# Patient Record
Sex: Male | Born: 1995 | Race: Black or African American | Hispanic: No | Marital: Single | Smoking: Never smoker
Health system: Southern US, Community
[De-identification: ages and names within clinical notes are randomized; demographics above are authoritative.]

## PROBLEM LIST (undated history)

## (undated) DIAGNOSIS — F909 Attention-deficit hyperactivity disorder, unspecified type: Secondary | ICD-10-CM

## (undated) HISTORY — PX: APPENDECTOMY: SHX54

## (undated) HISTORY — DX: Attention-deficit hyperactivity disorder, unspecified type: F90.9

---
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
## 2014-09-27 IMAGING — CR DG LUMBAR SPINE COMPLETE 4+V
5 series · 5 of 5 positions shown · non-contrast
Comparison: None.

CLINICAL DATA: Low back pain for 1 year, now worsening.

EXAM:
LUMBAR SPINE - COMPLETE 4+ VIEW

[t l-spine a.p.]
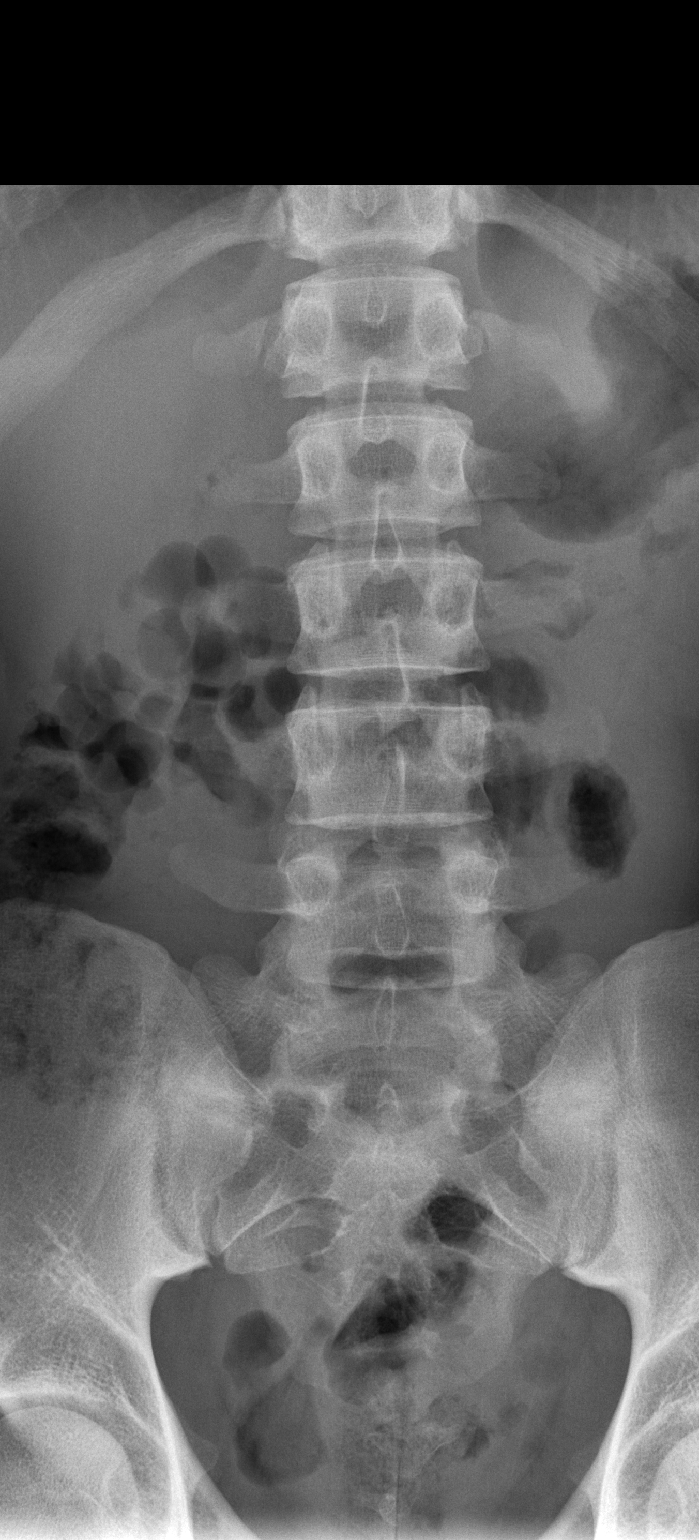

[t l-spine oblique exposure (1 of 2)]
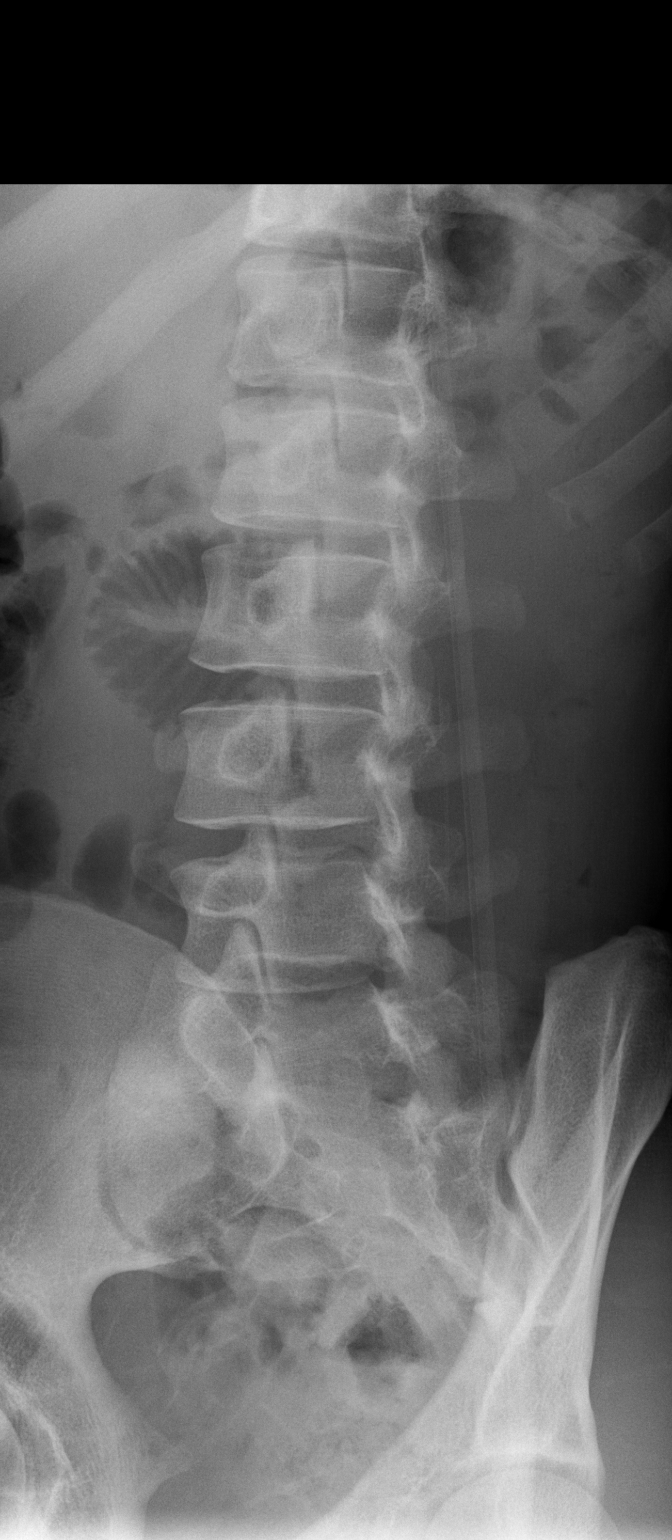

[t l-spine oblique exposure (2 of 2)]
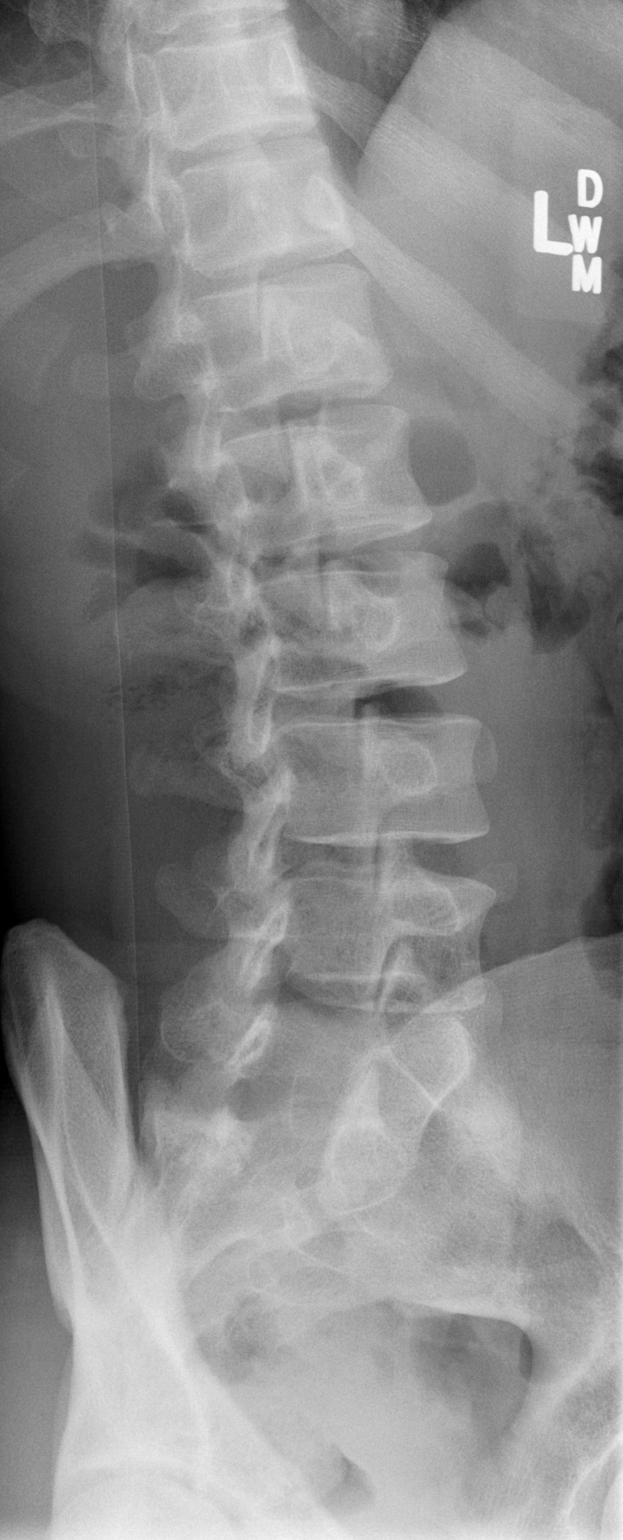

[t l-spine lat]
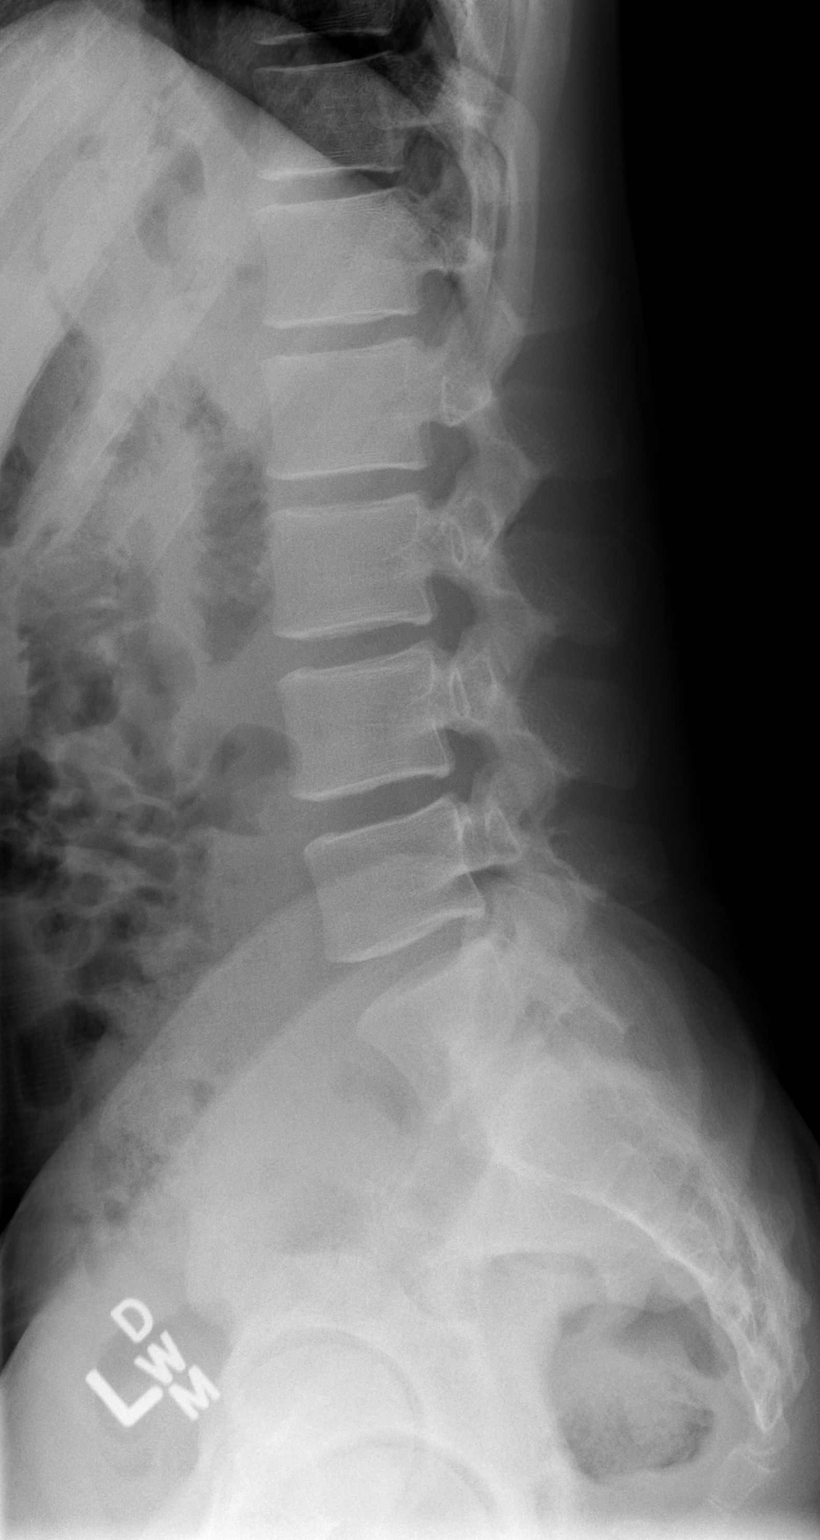

[t l-spine l5-s1 spot]
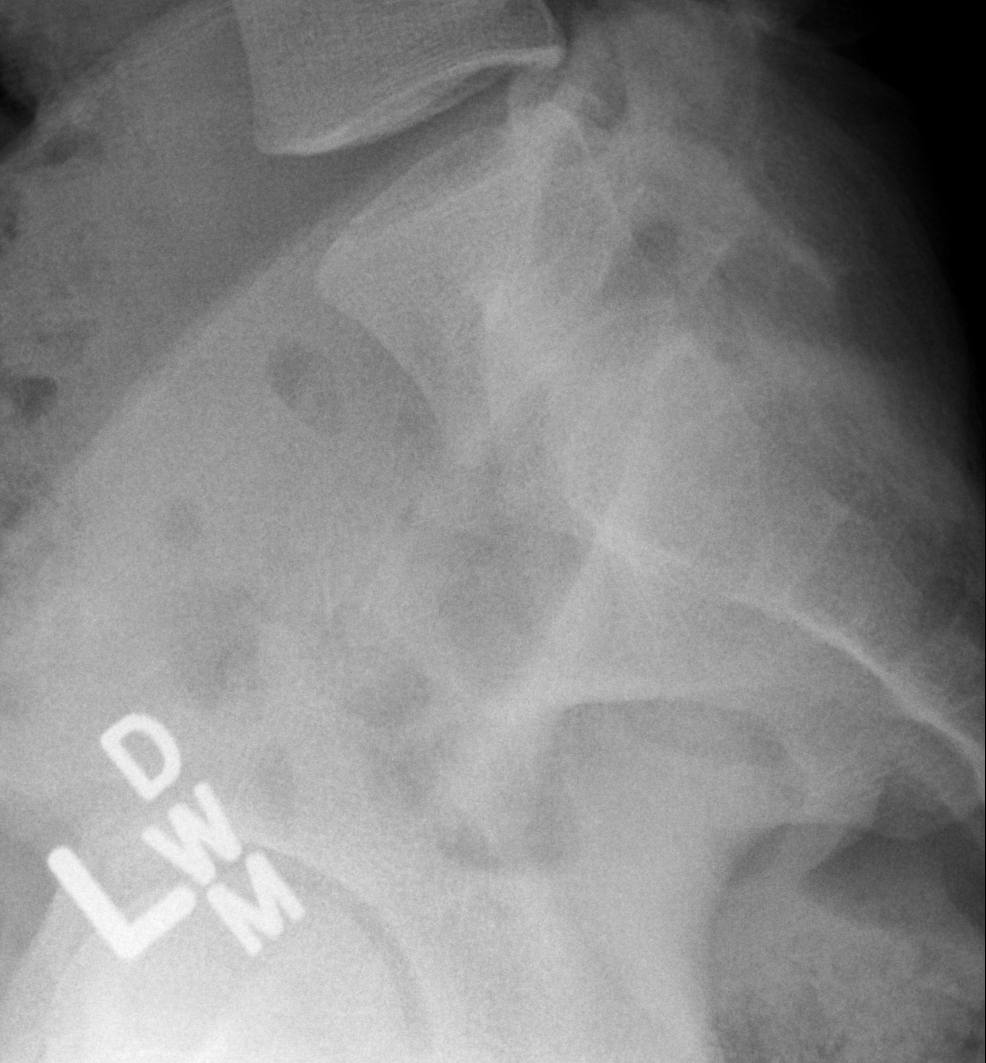

[5 of 5 positions shown; findings below may reference images not displayed]

FINDINGS: Four non rib-bearing lumbar-type vertebral bodies are intact and
aligned with maintenance of the lumbar lordosis. Small L1 ribs.
Intervertebral disc heights are normal. No pars interarticularis
defects. No destructive bony lesions.

Sacroiliac joints are symmetric. Included prevertebral and
paraspinal soft tissue planes are non-suspicious.
IMPRESSION: Transitional anatomy without acute fracture deformity or
malalignment.

  By: Rolan Tojin

## 2015-11-24 ENCOUNTER — Telehealth: Payer: Self-pay | Admitting: *Deleted

## 2015-11-24 NOTE — Telephone Encounter (Signed)
Unable to reach patient at time of Pre-Visit Call.  Left message for patient to return call when available.    

## 2015-11-25 ENCOUNTER — Encounter: Payer: Self-pay | Admitting: Family Medicine

## 2015-11-25 ENCOUNTER — Ambulatory Visit (INDEPENDENT_AMBULATORY_CARE_PROVIDER_SITE_OTHER): Payer: 59 | Admitting: Family Medicine

## 2015-11-25 VITALS — BP 108/76 | HR 67 | Temp 98.2°F | Ht 71.0 in | Wt 160.8 lb

## 2015-11-25 DIAGNOSIS — M549 Dorsalgia, unspecified: Secondary | ICD-10-CM

## 2015-11-25 DIAGNOSIS — G8929 Other chronic pain: Secondary | ICD-10-CM

## 2015-11-25 DIAGNOSIS — F909 Attention-deficit hyperactivity disorder, unspecified type: Secondary | ICD-10-CM | POA: Diagnosis not present

## 2015-11-25 HISTORY — DX: Attention-deficit hyperactivity disorder, unspecified type: F90.9

## 2015-11-25 MED ORDER — LISDEXAMFETAMINE DIMESYLATE 40 MG PO CAPS: 40.0000 mg | ORAL_CAPSULE | Freq: Every day | ORAL | 0 refills | Status: DC

## 2015-11-25 NOTE — Progress Notes (Signed)
Rx vPre visit review using our clinic review tool, if applicable. No additional management support is needed unless otherwise documented below in the visit note.

## 2015-11-25 NOTE — Patient Instructions (Signed)
EXERCISES  RANGE OF MOTION (ROM) AND STRETCHING EXERCISES - Low Back Sprain Most people with lower back pain will find that their symptoms get worse with excessive bending forward (flexion) or arching at the lower back (extension). The exercises that will help resolve your symptoms will focus on the opposite motion.  Your physician, physical therapist or athletic trainer will help you determine which exercises will be most helpful to resolve your lower back pain. Do not complete any exercises without first consulting with your caregiver. Discontinue any exercises which make your symptoms worse, until you speak to your caregiver. If you have pain, numbness or tingling which travels down into your buttocks, leg or foot, the goal of the therapy is for these symptoms to move closer to your back and eventually resolve. Sometimes, these leg symptoms will get better, but your lower back pain may worsen. This is often an indication of progress in your rehabilitation. Be very alert to any changes in your symptoms and the activities in which you participated in the 24 hours prior to the change. Sharing this information with your caregiver will allow him or her to most efficiently treat your condition. These exercises may help you when beginning to rehabilitate your injury. Your symptoms may resolve with or without further involvement from your physician, physical therapist or athletic trainer. While completing these exercises, remember:   Restoring tissue flexibility helps normal motion to return to the joints. This allows healthier, less painful movement and activity.  An effective stretch should be held for at least 30 seconds.  A stretch should never be painful. You should only feel a gentle lengthening or release in the stretched tissue. FLEXION RANGE OF MOTION AND STRETCHING EXERCISES:  STRETCH - Flexion, Single Knee to Chest   Lie on a firm bed or floor with both legs extended in front of you.  Keeping  one leg in contact with the floor, bring your opposite knee to your chest. Hold your leg in place by either grabbing behind your thigh or at your knee.  Pull until you feel a gentle stretch in your low back. Hold 15-20 seconds.  Slowly release your grasp and repeat the exercise with the opposite side. Repeat 2 times. Complete this exercise 1-2 times per day.   STRETCH - Flexion, Double Knee to Chest  Lie on a firm bed or floor with both legs extended in front of you.  Keeping one leg in contact with the floor, bring your opposite knee to your chest.  Tense your stomach muscles to support your back and then lift your other knee to your chest. Hold your legs in place by either grabbing behind your thighs or at your knees.  Pull both knees toward your chest until you feel a gentle stretch in your low back. Hold 15-20 seconds.  Tense your stomach muscles and slowly return one leg at a time to the floor. Repeat 2 times. Complete this exercise 1-2 times per day.   STRETCH - Low Trunk Rotation  Lie on a firm bed or floor. Keeping your legs in front of you, bend your knees so they are both pointed toward the ceiling and your feet are flat on the floor.  Extend your arms out to the side. This will stabilize your upper body by keeping your shoulders in contact with the floor.  Gently and slowly drop both knees together to one side until you feel a gentle stretch in your low back. Hold for 15-20 seconds.  Tense your   stomach muscles to support your lower back as you bring your knees back to the starting position. Repeat the exercise to the other side. Repeat 2 times. Complete this exercise 1-2 times per day  EXTENSION RANGE OF MOTION AND FLEXIBILITY EXERCISES:  STRETCH - Extension, Prone on Elbows   Lie on your stomach on the floor, a bed will be too soft. Place your palms about shoulder width apart and at the height of your head.  Place your elbows under your shoulders. If this is too  painful, stack pillows under your chest.  Allow your body to relax so that your hips drop lower and make contact more completely with the floor.  Hold this position for 15-20 seconds.  Slowly return to lying flat on the floor. Repeat 2 times. Complete this exercise 1-2 times per day.   RANGE OF MOTION - Extension, Prone Press Ups  Lie on your stomach on the floor, a bed will be too soft. Place your palms about shoulder width apart and at the height of your head.  Keeping your back as relaxed as possible, slowly straighten your elbows while keeping your hips on the floor. You may adjust the placement of your hands to maximize your comfort. As you gain motion, your hands will come more underneath your shoulders.  Hold this position 15-20 seconds.  Slowly return to lying flat on the floor. Repeat 2 times. Complete this exercise 1-2 times per day.   RANGE OF MOTION- Quadruped, Neutral Spine   Assume a hands and knees position on a firm surface. Keep your hands under your shoulders and your knees under your hips. You may place padding under your knees for comfort.  Drop your head and point your tailbone toward the ground below you. This will round out your lower back like an angry cat. Hold this position for 15-20 seconds.  Slowly lift your head and release your tail bone so that your back sags into a large arch, like an old horse.  Hold this position for 15-20 seconds.  Repeat this until you feel limber in your low back.  Now, find your "sweet spot." This will be the most comfortable position somewhere between the two previous positions. This is your neutral spine. Once you have found this position, tense your stomach muscles to support your low back.  Hold this position for 15-20 seconds. Repeat 2 times. Complete this exercise 1-2 times per day.  STRENGTHENING EXERCISES - Low Back Sprain These exercises may help you when beginning to rehabilitate your injury. These exercises should  be done near your "sweet spot." This is the neutral, low-back arch, somewhere between fully rounded and fully arched, that is your least painful position. When performed in this safe range of motion, these exercises can be used for people who have either a flexion or extension based injury. These exercises may resolve your symptoms with or without further involvement from your physician, physical therapist or athletic trainer. While completing these exercises, remember:   Muscles can gain both the endurance and the strength needed for everyday activities through controlled exercises.  Complete these exercises as instructed by your physician, physical therapist or athletic trainer. Increase the resistance and repetitions only as guided.  You may experience muscle soreness or fatigue, but the pain or discomfort you are trying to eliminate should never worsen during these exercises. If this pain does worsen, stop and make certain you are following the directions exactly. If the pain is still present after adjustments, discontinue the   exercise until you can discuss the trouble with your caregiver.  STRENGTHENING - Deep Abdominals, Pelvic Tilt   Lie on a firm bed or floor. Keeping your legs in front of you, bend your knees so they are both pointed toward the ceiling and your feet are flat on the floor.  Tense your lower abdominal muscles to press your low back into the floor. This motion will rotate your pelvis so that your tail bone is scooping upwards rather than pointing at your feet or into the floor. With a gentle tension and even breathing, hold this position for 10-15 seconds. Repeat 2 times. Complete this exercise 1 time per day.   STRENGTHENING - Abdominals, Crunches   Lie on a firm bed or floor. Keeping your legs in front of you, bend your knees so they are both pointed toward the ceiling and your feet are flat on the floor. Cross your arms over your chest.  Slightly tip your chin down  without bending your neck.  Tense your abdominals and slowly lift your trunk high enough to just clear your shoulder blades. Lifting higher can put excessive stress on the lower back and does not further strengthen your abdominal muscles.  Control your return to the starting position. Repeat 2 times. Complete this exercise once every 1-2 days.   STRENGTHENING - Quadruped, Opposite UE/LE Lift   Assume a hands and knees position on a firm surface. Keep your hands under your shoulders and your knees under your hips. You may place padding under your knees for comfort.  Find your neutral spine and gently tense your abdominal muscles so that you can maintain this position. Your shoulders and hips should form a rectangle that is parallel with the floor and is not twisted.  Keeping your trunk steady, lift your right hand no higher than your shoulder and then your left leg no higher than your hip. Make sure you are not holding your breath. Hold this position for 15-20 seconds.  Continuing to keep your abdominal muscles tense and your back steady, slowly return to your starting position. Repeat with the opposite arm and leg. Repeat 2 times. Complete this exercise once every 1-2 days.   STRENGTHENING - Abdominals and Quadriceps, Straight Leg Raise   Lie on a firm bed or floor with both legs extended in front of you.  Keeping one leg in contact with the floor, bend the other knee so that your foot can rest flat on the floor.  Find your neutral spine, and tense your abdominal muscles to maintain your spinal position throughout the exercise.  Slowly lift your straight leg off the floor about 6 inches for a count of 15, making sure to not hold your breath.  Still keeping your neutral spine, slowly lower your leg all the way to the floor. Repeat this exercise with each leg 2 times. Complete this exercise once every 1-2 days. POSTURE AND BODY MECHANICS CONSIDERATIONS - Low Back Sprain Keeping correct  posture when sitting, standing or completing your activities will reduce the stress put on different body tissues, allowing injured tissues a chance to heal and limiting painful experiences. The following are general guidelines for improved posture. Your physician or physical therapist will provide you with any instructions specific to your needs. While reading these guidelines, remember:  The exercises prescribed by your provider will help you have the flexibility and strength to maintain correct postures.  The correct posture provides the best environment for your joints to work. All of your   joints have less wear and tear when properly supported by a spine with good posture. This means you will experience a healthier, less painful body.  Correct posture must be practiced with all of your activities, especially prolonged sitting and standing. Correct posture is as important when doing repetitive low-stress activities (typing) as it is when doing a single heavy-load activity (lifting).  RESTING POSITIONS Consider which positions are most painful for you when choosing a resting position. If you have pain with flexion-based activities (sitting, bending, stooping, squatting), choose a position that allows you to rest in a less flexed posture. You would want to avoid curling into a fetal position on your side. If your pain worsens with extension-based activities (prolonged standing, working overhead), avoid resting in an extended position such as sleeping on your stomach. Most people will find more comfort when they rest with their spine in a more neutral position, neither too rounded nor too arched. Lying on a non-sagging bed on your side with a pillow between your knees, or on your back with a pillow under your knees will often provide some relief. Keep in mind, being in any one position for a prolonged period of time, no matter how correct your posture, can still lead to stiffness. PROPER SITTING  POSTURE In order to minimize stress and discomfort on your spine, you must sit with correct posture. Sitting with good posture should be effortless for a healthy body. Returning to good posture is a gradual process. Many people can work toward this most comfortably by using various supports until they have the flexibility and strength to maintain this posture on their own. When sitting with proper posture, your ears will fall over your shoulders and your shoulders will fall over your hips. You should use the back of the chair to support your upper back. Your lower back will be in a neutral position, just slightly arched. You may place a small pillow or folded towel at the base of your lower back for  support.  When working at a desk, create an environment that supports good, upright posture. Without extra support, muscles tire, which leads to excessive strain on joints and other tissues. Keep these recommendations in mind:  CHAIR:  A chair should be able to slide under your desk when your back makes contact with the back of the chair. This allows you to work closely.  The chair's height should allow your eyes to be level with the upper part of your monitor and your hands to be slightly lower than your elbows.  BODY POSITION  Your feet should make contact with the floor. If this is not possible, use a foot rest.  Keep your ears over your shoulders. This will reduce stress on your neck and low back.  INCORRECT SITTING POSTURES  If you are feeling tired and unable to assume a healthy sitting posture, do not slouch or slump. This puts excessive strain on your back tissues, causing more damage and pain. Healthier options include:  Using more support, like a lumbar pillow.  Switching tasks to something that requires you to be upright or walking.  Talking a brief walk.  Lying down to rest in a neutral-spine position.  PROLONGED STANDING WHILE SLIGHTLY LEANING FORWARD  When completing a task  that requires you to lean forward while standing in one place for a long time, place either foot up on a stationary 2-4 inch high object to help maintain the best posture. When both feet are on the ground,   the lower back tends to lose its slight inward curve. If this curve flattens (or becomes too large), then the back and your other joints will experience too much stress, tire more quickly, and can cause pain.  CORRECT STANDING POSTURES Proper standing posture should be assumed with all daily activities, even if they only take a few moments, like when brushing your teeth. As in sitting, your ears should fall over your shoulders and your shoulders should fall over your hips. You should keep a slight tension in your abdominal muscles to brace your spine. Your tailbone should point down to the ground, not behind your body, resulting in an over-extended swayback posture.   INCORRECT STANDING POSTURES  Common incorrect standing postures include a forward head, locked knees and/or an excessive swayback. WALKING Walk with an upright posture. Your ears, shoulders and hips should all line-up.  PROLONGED ACTIVITY IN A FLEXED POSITION When completing a task that requires you to bend forward at your waist or lean over a low surface, try to find a way to stabilize 3 out of 4 of your limbs. You can place a hand or elbow on your thigh or rest a knee on the surface you are reaching across. This will provide you more stability, so that your muscles do not tire as quickly. By keeping your knees relaxed, or slightly bent, you will also reduce stress across your lower back. CORRECT LIFTING TECHNIQUES  DO :  Assume a wide stance. This will provide you more stability and the opportunity to get as close as possible to the object which you are lifting.  Tense your abdominals to brace your spine. Bend at the knees and hips. Keeping your back locked in a neutral-spine position, lift using your leg muscles. Lift with your  legs, keeping your back straight.  Test the weight of unknown objects before attempting to lift them.  Try to keep your elbows locked down at your sides in order get the best strength from your shoulders when carrying an object.     Always ask for help when lifting heavy or awkward objects. INCORRECT LIFTING TECHNIQUES DO NOT:   Lock your knees when lifting, even if it is a small object.  Bend and twist. Pivot at your feet or move your feet when needing to change directions.  Assume that you can safely pick up even a paperclip without proper posture.   
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
## 2015-11-30 ENCOUNTER — Telehealth: Payer: Self-pay | Admitting: Family Medicine

## 2015-11-30 NOTE — Telephone Encounter (Signed)
Please advise 

## 2015-11-30 NOTE — Telephone Encounter (Signed)
Please advise. TL/CMA 

## 2015-11-30 NOTE — Telephone Encounter (Signed)
Patient called stating that he needs a note for school for his appt on Thursday 11/25/15. He would like to know if it could be sent through MyChart or emailed. I sent him the link to set up his MyChart today. Please advise   Patient phone:[phone]

## 2015-12-01 NOTE — Telephone Encounter (Signed)
School note completed and sent to the pt through MyChart.  Pt aware.//AB/CMA

## 2015-12-01 NOTE — Telephone Encounter (Signed)
Sure, OK to send. TY.

## 2015-12-30 ENCOUNTER — Ambulatory Visit (INDEPENDENT_AMBULATORY_CARE_PROVIDER_SITE_OTHER): Payer: 59 | Admitting: Family Medicine

## 2015-12-30 ENCOUNTER — Encounter: Payer: Self-pay | Admitting: Family Medicine

## 2015-12-30 VITALS — BP 110/78 | HR 58 | Temp 97.9°F | Resp 16 | Ht 71.0 in | Wt 169.0 lb

## 2015-12-30 DIAGNOSIS — F902 Attention-deficit hyperactivity disorder, combined type: Secondary | ICD-10-CM

## 2015-12-30 MED ORDER — LISDEXAMFETAMINE DIMESYLATE 40 MG PO CAPS: 40.0000 mg | ORAL_CAPSULE | Freq: Every day | ORAL | 0 refills | Status: DC

## 2015-12-30 MED ORDER — LISDEXAMFETAMINE DIMESYLATE 40 MG PO CAPS: 40.0000 mg | ORAL_CAPSULE | ORAL | 0 refills | Status: AC

## 2015-12-30 MED ORDER — LISDEXAMFETAMINE DIMESYLATE 40 MG PO CAPS: 40.0000 mg | ORAL_CAPSULE | ORAL | 0 refills | Status: DC

## 2015-12-30 NOTE — Progress Notes (Signed)
Chief Complaint  Patient presents with  . ADHD    follow up    Nicholas Stone is 20 y.o. male here for ADHD follow up.  Patient is currently on Vyvanse 40 mg and compliance is excellent. Symptoms include inattention, hyperactivity. Side effects include decreased appetite- improved, has gained 9 lbs. Patient believes their dose should be unchanged. Denies tics, weight loss, difficulties with sleep, self-medication, alcohol/drug abuse, chest pain, or palpitations.  ROS:  Heart- denies chest pain or palpitations Psych- as noted in HPI  Past Medical History:  Diagnosis Date  . Attention deficit hyperactivity disorder (ADHD) 11/25/2015   Social History   Social History  . Marital status: Single   Social History Main Topics  . Smoking status: Never Smoker  . Smokeless tobacco: Never Used  . Alcohol use No  . Drug use: No   Allergies as of 12/30/2015      Reactions   Penicillins    hives      Medication List       Accurate as of 12/30/15 11:13 AM. Always use your most recent med list.          lisdexamfetamine 40 MG capsule Commonly known as:  VYVANSE Take 1 capsule (40 mg total) by mouth daily. Start taking on:  01/07/2016   lisdexamfetamine 40 MG capsule Commonly known as:  VYVANSE Take 1 capsule (40 mg total) by mouth every morning. Start taking on:  02/10/2016   lisdexamfetamine 40 MG capsule Commonly known as:  VYVANSE Take 1 capsule (40 mg total) by mouth every morning. Start taking on:  03/09/2016       BP 110/78 (BP Location: Right Arm, Patient Position: Sitting, Cuff Size: Normal)   Pulse (!) 58   Temp 97.9 F (36.6 C) (Oral)   Resp 16   Ht 5\' 11"  (1.803 m)   Wt 169 lb (76.7 kg)   SpO2 98%   BMI 23.57 kg/m  Gen- awake, alert, appearing stated age HEENT- PERRLA, MMM Heart- RRR, no murmurs, no bruits Lungs- CTAB, no accessory muscle use Abd- soft, NT, ND, no masses or organomegaly Neuro- no facial tics Psych- age appropriate judgment and  insight, normal mood and affect  Attention deficit hyperactivity disorder (ADHD), combined type - Plan: lisdexamfetamine (VYVANSE) 40 MG capsule, lisdexamfetamine (VYVANSE) 40 MG capsule, lisdexamfetamine (VYVANSE) 40 MG capsule  Orders as above. Continue current dose. F/u in 3 mo for ADHD recheck. If still doing well, will see every 6 mo.  Pt voiced understanding and agreement to the plan.  Jilda Rocheicholas Paul DendronWendling, DO 12/30/15 11:13 AM

## 2019-09-04 ENCOUNTER — Ambulatory Visit: Payer: Self-pay | Admitting: Sports Medicine

## 2019-09-09 ENCOUNTER — Other Ambulatory Visit: Payer: Self-pay

## 2019-09-09 ENCOUNTER — Ambulatory Visit: Payer: 59 | Admitting: Podiatry

## 2019-09-09 ENCOUNTER — Encounter: Payer: Self-pay | Admitting: Podiatry

## 2019-09-09 ENCOUNTER — Ambulatory Visit (INDEPENDENT_AMBULATORY_CARE_PROVIDER_SITE_OTHER): Payer: 59

## 2019-09-09 VITALS — BP 105/66 | HR 60 | Temp 96.6°F | Resp 16

## 2019-09-09 DIAGNOSIS — B351 Tinea unguium: Secondary | ICD-10-CM | POA: Diagnosis not present

## 2019-09-09 DIAGNOSIS — M2011 Hallux valgus (acquired), right foot: Secondary | ICD-10-CM | POA: Diagnosis not present

## 2019-09-09 DIAGNOSIS — M2012 Hallux valgus (acquired), left foot: Secondary | ICD-10-CM

## 2019-09-09 NOTE — Patient Instructions (Signed)
Bunion  A bunion is a bump on the base of the big toe that forms when the bones of the big toe joint move out of position. Bunions may be small at first, but they often get larger over time. They can make walking painful. What are the causes? A bunion may be caused by:  Wearing narrow or pointed shoes that force the big toe to press against the other toes.  Abnormal foot development that causes the foot to roll inward (pronate).  Changes in the foot that are caused by certain diseases, such as rheumatoid arthritis or polio.  A foot injury. What increases the risk? The following factors may make you more likely to develop this condition:  Wearing shoes that squeeze the toes together.  Having certain diseases, such as: ? Rheumatoid arthritis. ? Polio. ? Cerebral palsy.  Having family members who have bunions.  Being born with a foot deformity, such as flat feet or low arches.  Doing activities that put a lot of pressure on the feet, such as ballet dancing. What are the signs or symptoms? The main symptom of a bunion is a noticeable bump on the big toe. Other symptoms may include:  Pain.  Swelling around the big toe.  Redness and inflammation.  Thick or hardened skin on the big toe or between the toes.  Stiffness or loss of motion in the big toe.  Trouble with walking. How is this diagnosed? A bunion may be diagnosed based on your symptoms, medical history, and activities. You may have tests, such as:  X-rays. These allow your health care provider to check the position of the bones in your foot and look for damage to your joint. They also help your health care provider determine the severity of your bunion and the best way to treat it.  Joint aspiration. In this test, a sample of fluid is removed from the toe joint. This test may be done if you are in a lot of pain. It helps rule out diseases that cause painful swelling of the joints, such as arthritis. How is this  treated? Treatment depends on the severity of your symptoms. The goal of treatment is to relieve symptoms and prevent the bunion from getting worse. Your health care provider may recommend:  Wearing shoes that have a wide toe box.  Using bunion pads to cushion the affected area.  Taping your toes together to keep them in a normal position.  Placing a device inside your shoe (orthotics) to help reduce pressure on your toe joint.  Taking medicine to ease pain, inflammation, and swelling.  Applying heat or ice to the affected area.  Doing stretching exercises.  Surgery to remove scar tissue and move the toes back into their normal position. This treatment is rare. Follow these instructions at home: Managing pain, stiffness, and swelling   If directed, put ice on the painful area: ? Put ice in a plastic bag. ? Place a towel between your skin and the bag. ? Leave the ice on for 20 minutes, 2-3 times a day. Activity   If directed, apply heat to the affected area before you exercise. Use the heat source that your health care provider recommends, such as a moist heat pack or a heating pad. ? Place a towel between your skin and the heat source. ? Leave the heat on for 20-30 minutes. ? Remove the heat if your skin turns bright red. This is especially important if you are unable to feel pain,   heat, or cold. You may have a greater risk of getting burned.  Do exercises as told by your health care provider. General instructions  Support your toe joint with proper footwear, shoe padding, or taping as told by your health care provider.  Take over-the-counter and prescription medicines only as told by your health care provider.  Keep all follow-up visits as told by your health care provider. This is important. Contact a health care provider if your symptoms:  Get worse.  Do not improve in 2 weeks. Get help right away if you have:  Severe pain and trouble with walking. Summary  A  bunion is a bump on the base of the big toe that forms when the bones of the big toe joint move out of position.  Bunions can make walking painful.  Treatment depends on the severity of your symptoms.  Support your toe joint with proper footwear, shoe padding, or taping as told by your health care provider. This information is not intended to replace advice given to you by your health care provider. Make sure you discuss any questions you have with your health care provider. Document Revised: 07/02/2017 Document Reviewed: 05/08/2017 Elsevier Patient Education  2020 Elsevier Inc.  

## 2019-09-09 NOTE — Progress Notes (Signed)
  Subjective:  Patient ID: Nicholas Stone, male    DOB: [birth year],  MRN: [id number] HPI Chief Complaint  Patient presents with  . Foot Pain    Bilateral; Bunions-joint; pt stated, "My Right foot hurts; has constant pain; is interested in getting surgery done"; Since 2011    24 y.o. male presents with the above complaint.   ROS: Denies fever chills nausea vomiting muscle aches pains calf pain back pain chest pain shortness of breath.  Past Medical History:  Diagnosis Date  . Attention deficit hyperactivity disorder (ADHD) 11/25/2015   Past Surgical History:  Procedure Laterality Date  . APPENDECTOMY      Current Outpatient Medications:  .  lisdexamfetamine (VYVANSE) 40 MG capsule, Take 1 capsule (40 mg total) by mouth every morning., Disp: 30 capsule, Rfl: 0  Allergies  Allergen Reactions  . Penicillins     hives   Review of Systems Objective:   Vitals:   09/09/19 1039  BP: 105/66  Pulse: 60  Resp: 16  Temp: (!) 96.6 F (35.9 C)    General: Well developed, nourished, in no acute distress, alert and oriented x3   Dermatological: Skin is warm, dry and supple bilateral. Nails x 10 are well maintained; remaining integument appears unremarkable at this time. There are no open sores, no preulcerative lesions, no rash or signs of infection present.  Hallux nails are thick yellow dystrophic clinically mycotic we will send these for pathologic evaluation  Vascular: Dorsalis Pedis artery and Posterior Tibial artery pedal pulses are 2/4 bilateral with immedate capillary fill time. Pedal hair growth present. No varicosities and no lower extremity edema present bilateral.   Neruologic: Grossly intact via light touch bilateral. Vibratory intact via tuning fork bilateral. Protective threshold with Semmes Wienstein monofilament intact to all pedal sites bilateral. Patellar and Achilles deep tendon reflexes 2+ bilateral. No Babinski or clonus noted bilateral.   Musculoskeletal: No  gross boney pedal deformities bilateral. No pain, crepitus, or limitation noted with foot and ankle range of motion bilateral. Muscular strength 5/5 in all groups tested bilateral.  Hallux abductovalgus deformity bilateral mild pes planus.  Limited range of motion on dorsiflexion there is no crepitation on motion.  He has pain on palpation to the dorsal medial aspect of the first metatarsophalangeal joint bilaterally.  Gait: Unassisted, Nonantalgic.    Radiographs:  Radiographs taken today demonstrate increase in the first intermetatarsal angle greater than normal value.  Hallux abductus angle greater than normal value.  Right appears to be worse than the left.  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  Nail dystrophy hallux bilateral and tinea pedis.  Plan: Discussed etiology pathology conservative versus surgical therapies at this point we discussed surgery in great detail today between him and his mother.  We decided on an [place] type bunion repair with double screw fixation and understands this.  We did discuss the possible postop complications which may include but not limited to postop pain bleeding swelling infection recurrence need for further surgery overcorrection under correction also digit loss of limb loss of life.  We provided him with information regarding the surgery center and anesthesia group as well as instructions for the morning of surgery.  We dispensed cam walkers bilaterally.  I also took samples of the hallux nails bilateral to be sent for pathologic evaluation I will follow-up with him in 1 month to discuss this.     Goldie Tregoning T. [place], [place]

## 2019-09-11 ENCOUNTER — Ambulatory Visit: Payer: Self-pay | Admitting: Sports Medicine

## 2019-11-11 ENCOUNTER — Encounter: Payer: Self-pay | Admitting: Podiatry

## 2019-11-11 DIAGNOSIS — M79676 Pain in unspecified toe(s): Secondary | ICD-10-CM

## 2019-12-16 ENCOUNTER — Telehealth: Payer: Self-pay | Admitting: Podiatry

## 2019-12-16 NOTE — Telephone Encounter (Signed)
Nicholas Stone is having surgery next month. His Mother would like FMLA forms completed so she can take care of him after the procedure. She would like to be out of work for 12weeks to assist her son. Please advise on amount of time out for patients Mother.

## 2019-12-16 NOTE — Telephone Encounter (Signed)
8 weeks to start.

## 2020-01-22 ENCOUNTER — Telehealth: Payer: Self-pay

## 2020-01-22 NOTE — Telephone Encounter (Addendum)
DOS 01/30/2020  [place] BUNIONECTOMY B/L - 46568  [employer] EFFECTIVE DATE - 01/10/2019  PLAN DEDUCTIBLE - $300.00 W/ $300.00 REMAINING OUT OF POCKET - $2000.00  W/ $2000.00 REMAINING COPAY $0.00 COINSURANCE - 90%  PER AUTOMATED SYSTEM NO PRECERT REQUIRED FOR CPT 28296. CALL REF# [id number]  RECEIVED THE FOLLOWING EMAIL FROM CYNTHIA AT [facility] ON 01/30/2020  Pt advised when he was at check in that he has new insurance, but does not have his new card.   Pt has [employer] primary effective 01/10/20, under BS#[id number], group#[id number].  He does still have [employer] secondary.  Authorization is required for code 84665 with [facility].  SUBMITTED AUTH REQUEST ON [employer] WEBSITE. TRACKING # [id number]   [employer] AUTH APPROVED FOR CPT [phone] B/L, AUTH # [id number] GOOD FROM 01/30/2020 - 04/29/2020.

## 2020-01-29 ENCOUNTER — Other Ambulatory Visit: Payer: Self-pay | Admitting: Podiatry

## 2020-01-29 ENCOUNTER — Encounter: Payer: 59 | Admitting: Podiatry

## 2020-01-29 MED ORDER — CLINDAMYCIN HCL 150 MG PO CAPS: 150.0000 mg | ORAL_CAPSULE | Freq: Three times a day (TID) | ORAL | 0 refills | Status: DC

## 2020-01-29 MED ORDER — ONDANSETRON HCL 4 MG PO TABS: 4.0000 mg | ORAL_TABLET | Freq: Three times a day (TID) | ORAL | 0 refills | Status: AC | PRN

## 2020-01-29 MED ORDER — OXYCODONE-ACETAMINOPHEN 10-325 MG PO TABS: 1.0000 | ORAL_TABLET | Freq: Three times a day (TID) | ORAL | 0 refills | Status: AC | PRN

## 2020-01-30 ENCOUNTER — Telehealth: Payer: Self-pay | Admitting: Podiatry

## 2020-01-30 DIAGNOSIS — M2011 Hallux valgus (acquired), right foot: Secondary | ICD-10-CM | POA: Diagnosis not present

## 2020-01-30 DIAGNOSIS — M2012 Hallux valgus (acquired), left foot: Secondary | ICD-10-CM

## 2020-01-30 NOTE — Telephone Encounter (Signed)
Pt is in a lot of pain, his mother is wondering if she could get more pain meds or get something where he can take it every 4 hours. Patient is also wondering if he can loosen up the surgical boot.

## 2020-02-05 ENCOUNTER — Other Ambulatory Visit: Payer: Self-pay

## 2020-02-05 ENCOUNTER — Ambulatory Visit (INDEPENDENT_AMBULATORY_CARE_PROVIDER_SITE_OTHER): Payer: 59 | Admitting: Podiatry

## 2020-02-05 ENCOUNTER — Encounter: Payer: 59 | Admitting: Podiatry

## 2020-02-05 ENCOUNTER — Telehealth: Payer: Self-pay | Admitting: *Deleted

## 2020-02-05 ENCOUNTER — Ambulatory Visit (INDEPENDENT_AMBULATORY_CARE_PROVIDER_SITE_OTHER): Payer: 59

## 2020-02-05 ENCOUNTER — Encounter: Payer: Self-pay | Admitting: Podiatry

## 2020-02-05 DIAGNOSIS — M2011 Hallux valgus (acquired), right foot: Secondary | ICD-10-CM

## 2020-02-05 DIAGNOSIS — M2012 Hallux valgus (acquired), left foot: Secondary | ICD-10-CM

## 2020-02-05 DIAGNOSIS — Z9889 Other specified postprocedural states: Secondary | ICD-10-CM

## 2020-02-05 MED ORDER — TERBINAFINE HCL 250 MG PO TABS: 250.0000 mg | ORAL_TABLET | Freq: Every day | ORAL | 0 refills | Status: AC

## 2020-02-05 NOTE — Telephone Encounter (Signed)
Yes it can be the drug but if he starts to have a fever then not from his feet. His feet look great.

## 2020-02-05 NOTE — Telephone Encounter (Signed)
Mother stated that her son was super tired, headache, nausea after his visit today. She has given him Oxycodone-acetaminophen prescribed.Please advise if this is normal.

## 2020-02-06 NOTE — Progress Notes (Addendum)
He presents today for first postop visit date of surgery is on 01/30/2020 status post [facility] bunionectomy bilaterally.  States that they have been sore and he states that he fell but not landing on his foot.  He presents today in wheelchair nonambulatory.  Denies fever chills nausea vomiting muscle aches pains calf pain back pain chest pain shortness of breath just pain in the feet.  Objective: Cam walkers are intact was removed demonstrates dry sterile dressing intact once removed demonstrates mild edema no erythema cellulitis drainage or odor he has good range of motion first metatarsophalangeal joints bilaterally.  This was passively actively he is not able to move the toes.  Pathology does demonstrate onychomycosis Trichophyton rubrum.   Radiographs taken today demonstrate capital osteotomies are intact moderate edema is noted and lateral views.  Internal fixation is in good position and appears to be tightened.  Assessment: Well-healing surgical foot bilaterally.  Onychomycosis per pathology  Plan: Discussed etiology pathology conservative versus surgical therapies.  I did discuss with him passive and active range of motion.  I did redress the toes today in the foot in such a way that he could exercise.  I will place him back in his cam walker I will follow-up with him in 1 week for suture removal.  We discussed topical therapy laser therapy and oral therapy today at this point he chose oral therapy.  We are requesting that he start with Lamisil 1 tablet 1 p.o. daily.  I will follow-up with him in 1 month for this at which time we will request blood work.

## 2020-02-11 NOTE — Telephone Encounter (Signed)
Called and left V message for return call back to give information per Dr Geryl Rankins note.

## 2020-02-12 ENCOUNTER — Encounter: Payer: 59 | Admitting: Podiatry

## 2020-02-19 ENCOUNTER — Other Ambulatory Visit: Payer: Self-pay

## 2020-02-19 ENCOUNTER — Encounter: Payer: 59 | Admitting: Podiatry

## 2020-02-19 ENCOUNTER — Encounter: Payer: Self-pay | Admitting: Podiatry

## 2020-02-19 ENCOUNTER — Ambulatory Visit (INDEPENDENT_AMBULATORY_CARE_PROVIDER_SITE_OTHER): Payer: 59 | Admitting: Podiatry

## 2020-02-19 DIAGNOSIS — M2011 Hallux valgus (acquired), right foot: Secondary | ICD-10-CM

## 2020-02-19 DIAGNOSIS — M2012 Hallux valgus (acquired), left foot: Secondary | ICD-10-CM | POA: Diagnosis not present

## 2020-02-19 DIAGNOSIS — Z9889 Other specified postprocedural states: Secondary | ICD-10-CM

## 2020-02-21 NOTE — Progress Notes (Signed)
He presents today being pushed around by his mother in a wheelchair for bilateral bunion repairs.  Stating that his feet feel better that he is not doing much walking on them.  Date of surgery January 30, 2020.  Denies fever chills nausea vomiting muscle aches pains calf pain back pain chest pain shortness of breath.  Objective: Feet are mildly edematous pulses remain palpable he has no open lesions or wounds.  Any remaining sutures were removed.  There is no signs of infection is good range of motion though are moderately tender on palpation.  He does have reactive hyperkeratosis to the fifth met base bilateral  Assessment well-healing surgical foot bilateral status post [facility] bunion repair.  Severe benign skin lesions with hypertrophic tissue.  Plan: Debrided all reactive hyperkeratotic tissue for him today placed him in Darco shoes with compression anklets I encouraged range of motion exercises and encouraged him to get away from the wheelchair.  We did discuss appropriate shoe gear today that may help with his not developing these calluses in the future.  I will follow-up with him in a couple of weeks.  I recommended that he start soaking and washing his feet daily.

## 2020-02-26 ENCOUNTER — Encounter: Payer: 59 | Admitting: Podiatry

## 2020-03-04 ENCOUNTER — Ambulatory Visit (INDEPENDENT_AMBULATORY_CARE_PROVIDER_SITE_OTHER): Payer: 59

## 2020-03-04 ENCOUNTER — Other Ambulatory Visit: Payer: Self-pay

## 2020-03-04 ENCOUNTER — Encounter: Payer: 59 | Admitting: Podiatry

## 2020-03-04 ENCOUNTER — Ambulatory Visit (INDEPENDENT_AMBULATORY_CARE_PROVIDER_SITE_OTHER): Payer: 59 | Admitting: Podiatry

## 2020-03-04 ENCOUNTER — Encounter: Payer: Self-pay | Admitting: Podiatry

## 2020-03-04 DIAGNOSIS — Z9889 Other specified postprocedural states: Secondary | ICD-10-CM

## 2020-03-04 DIAGNOSIS — M2011 Hallux valgus (acquired), right foot: Secondary | ICD-10-CM

## 2020-03-04 DIAGNOSIS — M2012 Hallux valgus (acquired), left foot: Secondary | ICD-10-CM

## 2020-03-04 DIAGNOSIS — Q828 Other specified congenital malformations of skin: Secondary | ICD-10-CM

## 2020-03-04 NOTE — Progress Notes (Signed)
He presents today date of surgery 121 2022 also bunionectomies bilaterally.  States my feet are fine my back hurts a little bit.  He also states of severe fatigue with any ambulation at all but states that his feet really bother him during that ambulation.  States that this is seems to have come on since surgery.  States that the calluses on the side of his foot is cracked for he tried peeling it off and he states that that is quite tender.  Objective: Vital signs are stable he is alert oriented x3 the feet demonstrate minimal edema no erythema cellulitis drainage or odor still great range of motion of the first metatarsophalangeal joint though the right one is little more tender on dorsiflexion than the left.  Radiographs taken today demonstrate well healing osteotomies internal fixation is in good position and appears to be tight.  Reactive hyper keratomas lateral and medial aspect of the foot demonstrated subfifth and subfirst were debrided for him today.  Assessment: Well-healing surgical foot date of surgery January 21st 2022 and painful corns and calluses.  Plan: Discussed etiology pathology conservative versus surgical therapies.  Debrided all reactive hyperkeratosis for him placed him in a compression anklet and Darco shoes I will follow-up with him in 2 weeks.  Another set of x-rays to be done at that time.

## 2020-03-11 ENCOUNTER — Encounter: Payer: 59 | Admitting: Podiatry

## 2020-03-23 ENCOUNTER — Encounter: Payer: Self-pay | Admitting: Podiatry

## 2020-03-23 ENCOUNTER — Ambulatory Visit (INDEPENDENT_AMBULATORY_CARE_PROVIDER_SITE_OTHER): Payer: 59 | Admitting: Podiatry

## 2020-03-23 ENCOUNTER — Ambulatory Visit (INDEPENDENT_AMBULATORY_CARE_PROVIDER_SITE_OTHER): Payer: 59

## 2020-03-23 ENCOUNTER — Other Ambulatory Visit: Payer: Self-pay

## 2020-03-23 DIAGNOSIS — M2011 Hallux valgus (acquired), right foot: Secondary | ICD-10-CM

## 2020-03-23 DIAGNOSIS — Z9889 Other specified postprocedural states: Secondary | ICD-10-CM

## 2020-03-23 DIAGNOSIS — M2012 Hallux valgus (acquired), left foot: Secondary | ICD-10-CM

## 2020-03-23 NOTE — Progress Notes (Signed)
He presents today for follow-up of his bilateral [facility] bunionectomy date of surgery 01/30/2020.  He denies fever chills nausea vomiting muscle aches and pain states that they are doing better.  Still little tender going downstairs.  He continues to wear his Darco shoes and has not been in tennis shoes as of yet.  Objective: Vital signs are stable alert oriented x3.  Pulses are palpable.  Mild edema about the first metatarsophalangeal joint right greater than left.  Has great range of motion dorsiflexion plantarflexion.  Assessment: Pes planovalgus well-healing surgical foot bilateral.  Plan: At this point would encourage range of motion exercises should his mother how to do this and have him soak his feet and to ice them after his physical activities and then also He comes in provide his swelling is diminished we will get him into a set of orthotics.

## 2020-04-27 ENCOUNTER — Other Ambulatory Visit: Payer: Self-pay

## 2020-04-27 ENCOUNTER — Ambulatory Visit (INDEPENDENT_AMBULATORY_CARE_PROVIDER_SITE_OTHER): Payer: 59 | Admitting: Podiatry

## 2020-04-27 ENCOUNTER — Ambulatory Visit (INDEPENDENT_AMBULATORY_CARE_PROVIDER_SITE_OTHER): Payer: 59

## 2020-04-27 DIAGNOSIS — M2012 Hallux valgus (acquired), left foot: Secondary | ICD-10-CM

## 2020-04-27 DIAGNOSIS — M2011 Hallux valgus (acquired), right foot: Secondary | ICD-10-CM

## 2020-04-27 DIAGNOSIS — Z9889 Other specified postprocedural states: Secondary | ICD-10-CM

## 2020-04-27 NOTE — Progress Notes (Signed)
He presents today for postop visit date of surgery 01/30/2020 [facility] bunionectomy bilaterally.  He states that they are feeling nearly 100% improved.  Objective: Vital signs are stable alert and oriented x3 there is no edema no erythema cellulitis drainage or odor incision sites are healing very nicely.  Scars are mildly thickened is great range of motion dorsiflexion and plantarflexion.  Radiographs taken today demonstrate osseously mature individual with [facility] type osteotomies and internal fixation in good position with healing osteotomies.  Assessment: Well-healing surgical foot bilaterally.  Plan: Discussed the use of silicone sheeting for the scars otherwise he will follow-up with me on an as-needed basis and allow him to try to start get back to his regular activity and back to work this Friday.

## 2020-10-20 ENCOUNTER — Telehealth: Payer: Self-pay | Admitting: Podiatry

## 2020-10-20 ENCOUNTER — Encounter: Payer: Self-pay | Admitting: Podiatry

## 2020-10-20 NOTE — Telephone Encounter (Signed)
Patient called the office stating he had surgery at the beginning of this year and he is still having sharp pain in his right foot as of yesterday and wants to know if he can get a work note for tomorrow.

## 2020-10-20 NOTE — Telephone Encounter (Signed)
I called the patient back to let him know I have sent his work note through [employer]

## 2020-10-21 ENCOUNTER — Telehealth: Payer: Self-pay | Admitting: Podiatry

## 2020-10-21 ENCOUNTER — Encounter: Payer: Self-pay | Admitting: Podiatry

## 2020-10-21 NOTE — Telephone Encounter (Signed)
Patient needs another extended note for the rest of the week and report back on Monday due to severe foot pain.

## 2020-10-22 ENCOUNTER — Telehealth: Payer: Self-pay | Admitting: Podiatry

## 2020-10-22 NOTE — Telephone Encounter (Signed)
Okay for note

## 2020-10-22 NOTE — Telephone Encounter (Signed)
Patient called office again requesting an out of work note be sent through [employer] extending his time off through next Tuesday. Thanks.
